# Patient Record
Sex: Female | Born: 1985 | Race: White | Hispanic: No | Marital: Single | Smoking: Never smoker
Health system: Southern US, Community
[De-identification: ages and names within clinical notes are randomized; demographics above are authoritative.]

---
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
## 2006-04-29 ENCOUNTER — Emergency Department: Payer: Self-pay | Admitting: Emergency Medicine

## 2006-08-07 ENCOUNTER — Emergency Department: Payer: Self-pay | Admitting: Emergency Medicine

## 2007-05-14 ENCOUNTER — Observation Stay: Payer: Self-pay

## 2008-09-21 ENCOUNTER — Emergency Department: Payer: Self-pay | Admitting: Emergency Medicine

## 2008-09-29 ENCOUNTER — Ambulatory Visit: Payer: Self-pay | Admitting: Emergency Medicine

## 2008-11-07 ENCOUNTER — Ambulatory Visit: Payer: Self-pay | Admitting: Family Medicine

## 2009-04-21 ENCOUNTER — Emergency Department: Payer: Self-pay | Admitting: Emergency Medicine

## 2010-12-06 ENCOUNTER — Emergency Department: Payer: Self-pay | Admitting: Emergency Medicine

## 2011-01-04 ENCOUNTER — Emergency Department: Payer: Self-pay | Admitting: Emergency Medicine

## 2012-03-21 ENCOUNTER — Emergency Department: Payer: Self-pay | Admitting: *Deleted

## 2012-03-25 LAB — WOUND CULTURE

## 2013-12-18 ENCOUNTER — Emergency Department: Payer: Self-pay | Admitting: Internal Medicine

## 2013-12-26 ENCOUNTER — Emergency Department: Payer: Self-pay | Admitting: Emergency Medicine

## 2013-12-26 LAB — URINALYSIS, COMPLETE
Bilirubin,UR: NEGATIVE
Glucose,UR: NEGATIVE mg/dL (ref 0–75)
NITRITE: NEGATIVE
PH: 5 (ref 4.5–8.0)
RBC,UR: 21 /HPF (ref 0–5)
SPECIFIC GRAVITY: 1.029 (ref 1.003–1.030)
WBC UR: 17 /HPF (ref 0–5)

## 2014-09-12 ENCOUNTER — Emergency Department: Payer: Self-pay | Admitting: Emergency Medicine

## 2014-11-28 ENCOUNTER — Emergency Department: Payer: Self-pay | Admitting: Emergency Medicine

## 2016-05-28 DIAGNOSIS — T63331A Toxic effect of venom of brown recluse spider, accidental (unintentional), initial encounter: Secondary | ICD-10-CM | POA: Insufficient documentation

## 2016-05-28 NOTE — ED Triage Notes (Signed)
Has an insect bite to the back of her head since this morning. Red, raised area with central puncture wound. States she was in the woods when it occurred this morning.

## 2016-05-29 ENCOUNTER — Emergency Department
Admission: EM | Admit: 2016-05-29 | Discharge: 2016-05-29 | Disposition: A | Discharge status: Home / Self Care | Payer: Self-pay | Attending: Emergency Medicine | Admitting: Emergency Medicine

## 2016-05-29 DIAGNOSIS — T63331A Toxic effect of venom of brown recluse spider, accidental (unintentional), initial encounter: Secondary | ICD-10-CM

## 2016-05-29 MED ORDER — HYDROCODONE-ACETAMINOPHEN 5-325 MG PO TABS
ORAL_TABLET | ORAL | Status: AC
  Administered 2016-05-29: 1 via ORAL
  Filled 2016-05-29: qty 1

## 2016-05-29 MED ORDER — TRAMADOL HCL 50 MG PO TABS: 50.0000 mg | ORAL_TABLET | Freq: Four times a day (QID) | ORAL | 0 refills | Status: DC | PRN

## 2016-05-29 MED ORDER — CEPHALEXIN 500 MG PO CAPS: 500.0000 mg | ORAL_CAPSULE | Freq: Four times a day (QID) | ORAL | 0 refills | Status: DC

## 2016-05-29 MED ORDER — HYDROCODONE-ACETAMINOPHEN 5-325 MG PO TABS
1.0000 | ORAL_TABLET | Freq: Once | ORAL | Status: AC
  Administered 2016-05-29: 1 via ORAL

## 2016-05-29 NOTE — ED Provider Notes (Signed)
[facility] Emergency Department Provider Note  ____________________________________________  Time seen: Approximately 12:55 AM  I have reviewed the triage vital signs and the nursing notes.   HISTORY  Chief Complaint Insect Bite    HPI Jocelyn Rodriguez is a 30 y.o. female presents emergency department complaining of a bug bite to the left occipital region of the skull. Patient reports that she was riding/earlier today. She denied knowing exactly when she was bit states that she has a very tender area that is raised to the left occipital region of the skull. No fevers or chills, nausea or vomiting, or other symptoms. Patient reports that pain is sharp, constant, moderate to severe. Patient has tried 800 mg of ibuprofen with no relief.   History reviewed. No pertinent past medical history.  There are no active problems to display for this patient.   History reviewed. No pertinent surgical history.  Prior to Admission medications   Medication Sig Start Date End Date Taking? Authorizing Provider  cephALEXin (KEFLEX) 500 MG capsule Take 1 capsule (500 mg total) by mouth 4 (four) times daily. 05/29/16   Delorise Royals Cuthriell, PA-C  traMADol (ULTRAM) 50 MG tablet Take 1 tablet (50 mg total) by mouth every 6 (six) hours as needed. 05/29/16   Delorise Royals Cuthriell, PA-C    Allergies Review of patient's allergies indicates no known allergies.  No family history on file.  Social History Social History  Substance Use Topics  . Smoking status: Not on file  . Smokeless tobacco: Not on file  . Alcohol use Not on file     Review of Systems  Constitutional: No fever/chills Cardiovascular: no chest pain. Respiratory: no cough. No SOB. Musculoskeletal: Negative for musculoskeletal pain. Skin: Positive for "bug bite" to occipital region of the skull Neurological: Negative for headaches, focal weakness or numbness. 10-point ROS otherwise  negative.  ____________________________________________   PHYSICAL EXAM:  VITAL SIGNS: ED Triage Vitals  Enc Vitals Group     BP --      Pulse Rate 05/28/16 2241 79     Resp --      Temp 05/28/16 2241 98.7 F (37.1 C)     Temp Source 05/28/16 2241 Oral     SpO2 --      Weight 05/28/16 2242 110 lb (49.9 kg)     Height 05/28/16 2242 5\' 2"  (1.575 m)     Head Circumference --      Peak Flow --      Pain Score 05/28/16 2242 7     Pain Loc --      Pain Edu? --      Excl. in GC? --      Constitutional: Alert and oriented. Well appearing and in no acute distress. Eyes: Conjunctivae are normal. PERRL. EOMI. Head: Atraumatic.Erythematous and edematous skin lesion noted to the left occipital region of the skull. Area has central necrosis with surrounding erythema and edema with fasciculation. Area is very tender to palpation. Total erythema and edema is approximately 4 cm in diameter Neck: No stridor.   Hematological/Lymphatic/Immunilogical: No cervical lymphadenopathy. Cardiovascular: Normal rate, regular rhythm. Normal S1 and S2.  Good peripheral circulation. Respiratory: Normal respiratory effort without tachypnea or retractions. Lungs CTAB. Good air entry to the bases with no decreased or absent breath sounds. Musculoskeletal: Full range of motion to all extremities. No gross deformities appreciated. Neurologic:  Normal speech and language. No gross focal neurologic deficits are appreciated.  Skin:  Skin is warm, dry and  intact. No rash noted. Psychiatric: Mood and affect are normal. Speech and behavior are normal. Patient exhibits appropriate insight and judgement.   ____________________________________________   LABS (all labs ordered are listed, but only abnormal results are displayed)  Labs Reviewed - No data to display ____________________________________________  EKG   ____________________________________________  RADIOLOGY   No results  found.  ____________________________________________    PROCEDURES  Procedure(s) performed:    Procedures    Medications  HYDROcodone-acetaminophen (NORCO/VICODIN) 5-325 MG per tablet 1 tablet (1 tablet Oral Given 05/29/16 0105)     ____________________________________________   INITIAL IMPRESSION / ASSESSMENT AND PLAN / ED COURSE  Pertinent labs & imaging results that were available during my care of the patient were reviewed by me and considered in my medical decision making (see chart for details).  Review of the [place] CSRS was performed in accordance of the [facility] prior to dispensing any controlled drugs.  Clinical Course    Patient's diagnosis is consistent with Probable brown recluse bite to the left occipital region of the skull. Lesion is typical of brown recluse spider bite with central necrosis with surrounding erythema and edema and fasciculations.. Patient will be discharged home with prescriptions for antibiotics and limited pain medication. Patient is to follow up with primary care as needed or otherwise directed. Patient is given ED precautions to return to the ED for any worsening or new symptoms.     ____________________________________________  FINAL CLINICAL IMPRESSION(S) / ED DIAGNOSES  Final diagnoses:  Brown recluse spider bite, accidental or unintentional, initial encounter      NEW MEDICATIONS STARTED DURING THIS VISIT:  New Prescriptions   CEPHALEXIN (KEFLEX) 500 MG CAPSULE    Take 1 capsule (500 mg total) by mouth 4 (four) times daily.   TRAMADOL (ULTRAM) 50 MG TABLET    Take 1 tablet (50 mg total) by mouth every 6 (six) hours as needed.        This chart was dictated using voice recognition software/Dragon. Despite best efforts to proofread, errors can occur which can change the meaning. Any change was purely unintentional.    Racheal PatchesJonathan D Cuthriell, PA-C 05/29/16 0108    Rebecka ApleyAllison P Webster, MD 05/30/16 [phone]

## 2019-09-01 ENCOUNTER — Observation Stay
Admission: EM | Admit: 2019-09-01 | Discharge: 2019-09-02 | Disposition: A | Discharge status: Home / Self Care | Payer: Self-pay | Attending: Internal Medicine | Admitting: Internal Medicine

## 2019-09-01 ENCOUNTER — Encounter: Payer: Self-pay | Admitting: Internal Medicine

## 2019-09-01 ENCOUNTER — Other Ambulatory Visit: Payer: Self-pay

## 2019-09-01 DIAGNOSIS — R21 Rash and other nonspecific skin eruption: Secondary | ICD-10-CM | POA: Diagnosis present

## 2019-09-01 DIAGNOSIS — A539 Syphilis, unspecified: Principal | ICD-10-CM | POA: Diagnosis present

## 2019-09-01 DIAGNOSIS — B370 Candidal stomatitis: Secondary | ICD-10-CM | POA: Diagnosis present

## 2019-09-01 DIAGNOSIS — Z20828 Contact with and (suspected) exposure to other viral communicable diseases: Secondary | ICD-10-CM | POA: Insufficient documentation

## 2019-09-01 LAB — URINALYSIS, COMPLETE (UACMP) WITH MICROSCOPIC
Bacteria, UA: NONE SEEN
Bilirubin Urine: NEGATIVE
Glucose, UA: NEGATIVE mg/dL
Hgb urine dipstick: NEGATIVE
Ketones, ur: NEGATIVE mg/dL
Leukocytes,Ua: NEGATIVE
Nitrite: NEGATIVE
Protein, ur: NEGATIVE mg/dL
Specific Gravity, Urine: 1.015 (ref 1.005–1.030)
pH: 8 (ref 5.0–8.0)

## 2019-09-01 LAB — CBC WITH DIFFERENTIAL/PLATELET
Abs Immature Granulocytes: 0.02 10*3/uL (ref 0.00–0.07)
Basophils Absolute: 0.1 10*3/uL (ref 0.0–0.1)
Basophils Relative: 2 %
Eosinophils Absolute: 0.1 10*3/uL (ref 0.0–0.5)
Eosinophils Relative: 2 %
HCT: 39.2 % (ref 36.0–46.0)
Hemoglobin: 12.8 g/dL (ref 12.0–15.0)
Immature Granulocytes: 0 %
Lymphocytes Relative: 22 %
Lymphs Abs: 1.1 10*3/uL (ref 0.7–4.0)
MCH: 28.8 pg (ref 26.0–34.0)
MCHC: 32.7 g/dL (ref 30.0–36.0)
MCV: 88.1 fL (ref 80.0–100.0)
Monocytes Absolute: 0.5 10*3/uL (ref 0.1–1.0)
Monocytes Relative: 11 %
Neutro Abs: 3.1 10*3/uL (ref 1.7–7.7)
Neutrophils Relative %: 63 %
Platelets: 321 10*3/uL (ref 150–400)
RBC: 4.45 MIL/uL (ref 3.87–5.11)
RDW: 12 % (ref 11.5–15.5)
WBC: 5 10*3/uL (ref 4.0–10.5)
nRBC: 0 % (ref 0.0–0.2)

## 2019-09-01 LAB — SEDIMENTATION RATE: Sed Rate: 53 mm/hr — ABNORMAL HIGH (ref 0–20)

## 2019-09-01 LAB — CHLAMYDIA/NGC RT PCR (ARMC ONLY): Chlamydia Tr: DETECTED — AB

## 2019-09-01 LAB — URINE DRUG SCREEN, QUALITATIVE (ARMC ONLY)
Amphetamines, Ur Screen: NOT DETECTED
Barbiturates, Ur Screen: NOT DETECTED
Benzodiazepine, Ur Scrn: NOT DETECTED
Cannabinoid 50 Ng, Ur ~~LOC~~: NOT DETECTED
Cocaine Metabolite,Ur ~~LOC~~: POSITIVE — AB
MDMA (Ecstasy)Ur Screen: NOT DETECTED
Methadone Scn, Ur: NOT DETECTED
Opiate, Ur Screen: NOT DETECTED
Phencyclidine (PCP) Ur S: NOT DETECTED
Tricyclic, Ur Screen: NOT DETECTED

## 2019-09-01 LAB — COMPREHENSIVE METABOLIC PANEL
ALT: 14 U/L (ref 0–44)
AST: 17 U/L (ref 15–41)
Albumin: 3.6 g/dL (ref 3.5–5.0)
Alkaline Phosphatase: 86 U/L (ref 38–126)
Anion gap: 10 (ref 5–15)
BUN: 12 mg/dL (ref 6–20)
CO2: 25 mmol/L (ref 22–32)
Calcium: 8.8 mg/dL — ABNORMAL LOW (ref 8.9–10.3)
Chloride: 103 mmol/L (ref 98–111)
Creatinine, Ser: 0.67 mg/dL (ref 0.44–1.00)
GFR calc Af Amer: >60 mL/min (ref >60)
GFR calc non Af Amer: >60 mL/min (ref >60)
Glucose, Bld: 98 mg/dL (ref 70–99)
Potassium: 4 mmol/L (ref 3.5–5.1)
Sodium: 138 mmol/L (ref 135–145)
Total Bilirubin: 0.5 mg/dL (ref 0.3–1.2)
Total Protein: 8.1 g/dL (ref 6.5–8.1)

## 2019-09-01 LAB — CHLAMYDIA/NGC RT PCR (ARMC ONLY)??????????: N gonorrhoeae: NOT DETECTED

## 2019-09-01 LAB — TSH: TSH: 0.914 u[IU]/mL (ref 0.350–4.500)

## 2019-09-01 LAB — C-REACTIVE PROTEIN: CRP: 5.2 mg/dL — ABNORMAL HIGH (ref <1.0)

## 2019-09-01 LAB — PREGNANCY, URINE: Preg Test, Ur: NEGATIVE

## 2019-09-01 LAB — SARS CORONAVIRUS 2 (TAT 6-24 HRS): SARS Coronavirus 2: NEGATIVE

## 2019-09-01 LAB — T4, FREE: Free T4: 0.76 ng/dL (ref 0.61–1.12)

## 2019-09-01 MED ORDER — DIPHENHYDRAMINE HCL 25 MG PO CAPS
25.0000 mg | ORAL_CAPSULE | Freq: Four times a day (QID) | ORAL | Status: DC | PRN
  Administered 2019-09-01 – 2019-09-02 (×3): 25 mg via ORAL
  Filled 2019-09-01 (×3): qty 1

## 2019-09-01 MED ORDER — ENOXAPARIN SODIUM 40 MG/0.4ML ~~LOC~~ SOLN
40.0000 mg | SUBCUTANEOUS | Status: DC
  Filled 2019-09-01: qty 0.4

## 2019-09-01 MED ORDER — ACETAMINOPHEN 650 MG RE SUPP: 650.0000 mg | Freq: Four times a day (QID) | RECTAL | Status: DC | PRN

## 2019-09-01 MED ORDER — PENICILLIN G BENZATHINE 1200000 UNIT/2ML IM SUSP
2.4000 10*6.[IU] | Freq: Once | INTRAMUSCULAR | Status: AC
  Administered 2019-09-01: 2.4 10*6.[IU] via INTRAMUSCULAR
  Filled 2019-09-01 (×2): qty 4

## 2019-09-01 MED ORDER — ACETAMINOPHEN 325 MG PO TABS: 650.0000 mg | ORAL_TABLET | Freq: Four times a day (QID) | ORAL | Status: DC | PRN

## 2019-09-01 MED ORDER — DEXTROSE 5 % IV SOLN
500.0000 mg | Freq: Once | INTRAVENOUS | Status: AC
  Administered 2019-09-01: 500 mg via INTRAVENOUS
  Filled 2019-09-01: qty 10

## 2019-09-01 MED ORDER — ACYCLOVIR 200 MG PO CAPS
400.0000 mg | ORAL_CAPSULE | Freq: Three times a day (TID) | ORAL | Status: DC
  Administered 2019-09-01 – 2019-09-02 (×2): 400 mg via ORAL
  Filled 2019-09-01 (×5): qty 2

## 2019-09-01 MED ORDER — SODIUM CHLORIDE 0.9 % IV SOLN
INTRAVENOUS | Status: DC
  Administered 2019-09-01 – 2019-09-02 (×5): via INTRAVENOUS

## 2019-09-01 MED ORDER — LORATADINE 10 MG PO TABS
10.0000 mg | ORAL_TABLET | Freq: Every day | ORAL | Status: DC | PRN
  Administered 2019-09-01: 10 mg via ORAL
  Filled 2019-09-01: qty 1

## 2019-09-01 MED ORDER — NYSTATIN 100000 UNIT/ML MT SUSP
5.0000 mL | Freq: Four times a day (QID) | OROMUCOSAL | Status: DC
  Administered 2019-09-01 – 2019-09-02 (×5): 500000 [IU] via ORAL
  Filled 2019-09-01 (×7): qty 5

## 2019-09-01 MED ORDER — ACYCLOVIR 200 MG PO CAPS
400.0000 mg | ORAL_CAPSULE | Freq: Three times a day (TID) | ORAL | Status: DC
  Administered 2019-09-01: 400 mg via ORAL
  Filled 2019-09-01 (×4): qty 2

## 2019-09-01 MED ORDER — ONDANSETRON HCL 4 MG PO TABS: 4.0000 mg | ORAL_TABLET | Freq: Four times a day (QID) | ORAL | Status: DC | PRN

## 2019-09-01 MED ORDER — ONDANSETRON HCL 4 MG/2ML IJ SOLN: 4.0000 mg | Freq: Four times a day (QID) | INTRAMUSCULAR | Status: DC | PRN

## 2019-09-01 MED ORDER — DIPHENHYDRAMINE HCL 50 MG/ML IJ SOLN
50.0000 mg | Freq: Once | INTRAMUSCULAR | Status: AC
Start: 2019-09-01 — End: 2019-09-01
  Administered 2019-09-01: 50 mg via INTRAVENOUS
  Filled 2019-09-01: qty 1

## 2019-09-01 MED ORDER — HEPARIN SODIUM (PORCINE) 5000 UNIT/ML IJ SOLN: 5000.0000 [IU] | Freq: Three times a day (TID) | INTRAMUSCULAR | Status: DC

## 2019-09-01 NOTE — H&P (Addendum)
History and Physical    [name]:[id number] DOB: Jul 30, 1986 DOA: 09/01/2019  Referring MD/NP/PA:   PCP: Patient, No Pcp Per   Patient coming from:  The patient is coming from home.  At baseline, pt is independent for most of ADL.        Chief Complaint: rash  HPI: Jocelyn Rodriguez is a 33 y.o. female without significant medical history, presents with rash.  Pt states that her rash started more than one week ago, which has been progressively worsening.  Since this morning, she noted diffuse rash all over her body. Pt has lesions from her scalp across her arms, trunk, legs, around her genitals. Pt states rash hurts over the bulk of her body, but itches around her genitals. Rash is varying in size, dull red in color, papular shape. Patient has vesicular rash in her vagina. No oral lesion, but has oral thrush on exam.  She denies drug use, but her UDS is positive for cocaine metabolites. She denies history of STDs or syphilis, tick bites, new medications. She reports that she slept in the same bed as her boyfriend and he does not have any similar rash.Patient denies any chest pain, shortness of breath, cough, fever or chills.  No nausea, vomiting, diarrhea, abdominal pain.  She reports dysuria and burning on urination sometimes.  ED Course: pt was found to have WBC 5.0, negative pregnancy test, UDS positive for cocaine metabolites, pending COVID-19 test, pending urinalysis, electrolytes renal function okay, temperature 99.7, blood pressure 102/69, heart rate 109, 89, oxygen saturation 99% on room air.  Patient is placed on MedSurg bed for observation.  ID was consulted by EDP, and will see patient tomorrow per ED physician.  Review of Systems:   General: no fevers, chills, no body weight gain, has fatigue HEENT: no blurry vision, hearing changes. Has oral thrush. Respiratory: no dyspnea, coughing, wheezing CV: no chest pain, no palpitations GI: no nausea, vomiting, abdominal  pain, diarrhea, constipation GU: no dysuria, burning on urination, increased urinary frequency, hematuria  Ext: no leg edema Neuro: no unilateral weakness, numbness, or tingling, no vision change or hearing loss Skin: has diffuse rash. Rash is varying in size, dull red in color, papular shape. Patient has vesicular rash in her vagina MSK: No muscle spasm, no deformity, no limitation of range of movement in spin Heme: No easy bruising.  Travel history: No recent long distant travel.  Allergy: No Known Allergies  No past medical history on file.  No past surgical history on file.  Social History:  reports that she has never smoked. She has never used smokeless tobacco. She reports that she does not drink alcohol or use drugs.  Family History:  Family History  Problem Relation Age of Onset  . Diabetes Mellitus II Mother   . Hypertension Mother      Prior to Admission medications   Medication Sig Start Date End Date Taking? Authorizing Provider  cephALEXin (KEFLEX) 500 MG capsule Take 1 capsule (500 mg total) by mouth 4 (four) times daily. 05/29/16   Cuthriell, Delorise Royals, PA-C  traMADol (ULTRAM) 50 MG tablet Take 1 tablet (50 mg total) by mouth every 6 (six) hours as needed. 05/29/16   Racheal Patches, PA-C    Physical Exam: Vitals:   09/01/19 1415 09/01/19 1726 09/01/19 1758 09/01/19 1759  BP: 109/63 106/64  101/62  Pulse: 93 99  100  Resp: Temp:   99.1 F (37.3 C) 99.1 F (  37.3 C)  TempSrc:   Oral   SpO2: 100% 100%  100%  Weight:      Height:       General: Not in acute distress HEENT:       Eyes: PERRL, EOMI, no scleral icterus.       ENT: No discharge from the ears and nose, no pharynx injection, no tonsillar enlargement.        Neck: No JVD, no bruit, no mass felt. Heme: No neck lymph node enlargement. Cardiac: S1/S2, RRR, No murmurs, No gallops or rubs. Respiratory: No rales, wheezing, rhonchi or rubs. GI: Soft, nondistended, nontender, no rebound  pain, no organomegaly, BS present. GU: No hematuria Ext: No pitting leg edema bilaterally. 2+DP/PT pulse bilaterally. Musculoskeletal: No joint deformities, No joint redness or warmth, no limitation of ROM in spin. Skin: has diffused rash.  Neuro: Alert, oriented X3, cranial nerves II-XII grossly intact, moves all extremities normally.  Psych: Patient is not psychotic, no suicidal or hemocidal ideation.  Labs on Admission: I have personally reviewed following labs and imaging studies  CBC: Recent Labs  Lab 09/01/19 0626  WBC 5.0  NEUTROABS 3.1  HGB 12.8  HCT 39.2  MCV 88.1  PLT 321   Basic Metabolic Panel: Recent Labs  Lab 09/01/19 0626  NA 138  K 4.0  CL 103  CO2 25  GLUCOSE 98  BUN 12  CREATININE 0.67  CALCIUM 8.8*   GFR: Estimated Creatinine Clearance: 79.8 mL/min (by C-G formula based on SCr of 0.67 mg/dL). Liver Function Tests: Recent Labs  Lab 09/01/19 0626  AST 17  ALT 14  ALKPHOS 86  BILITOT 0.5  PROT 8.1  ALBUMIN 3.6   No results for input(s): LIPASE, AMYLASE in the last 168 hours. No results for input(s): AMMONIA in the last 168 hours. Coagulation Profile: No results for input(s): INR, PROTIME in the last 168 hours. Cardiac Enzymes: No results for input(s): CKTOTAL, CKMB, CKMBINDEX, TROPONINI in the last 168 hours. BNP (last 3 results) No results for input(s): PROBNP in the last 8760 hours. HbA1C: No results for input(s): HGBA1C in the last 72 hours. CBG: No results for input(s): GLUCAP in the last 168 hours. Lipid Profile: No results for input(s): CHOL, HDL, LDLCALC, TRIG, CHOLHDL, LDLDIRECT in the last 72 hours. Thyroid Function Tests: Recent Labs    09/01/19 0626  TSH 0.914  FREET4 0.76   Anemia Panel: No results for input(s): VITAMINB12, FOLATE, FERRITIN, TIBC, IRON, RETICCTPCT in the last 72 hours. Urine analysis:    Component Value Date/Time   COLORURINE YELLOW (A) 09/01/2019 0831   APPEARANCEUR CLOUDY (A) 09/01/2019 0831    APPEARANCEUR Cloudy 12/26/2013 1019   LABSPEC 1.015 09/01/2019 0831   LABSPEC 1.029 12/26/2013 1019   PHURINE 8.0 09/01/2019 0831   GLUCOSEU NEGATIVE 09/01/2019 0831   GLUCOSEU Negative 12/26/2013 1019   HGBUR NEGATIVE 09/01/2019 0831   BILIRUBINUR NEGATIVE 09/01/2019 0831   BILIRUBINUR Negative 12/26/2013 1019   KETONESUR NEGATIVE 09/01/2019 0831   PROTEINUR NEGATIVE 09/01/2019 0831   NITRITE NEGATIVE 09/01/2019 0831   LEUKOCYTESUR NEGATIVE 09/01/2019 0831   LEUKOCYTESUR 3+ 12/26/2013 1019   Sepsis Labs: @LABRCNTIP (procalcitonin:4,lacticidven:4) ) Recent Results (from the past 240 hour(s))  Blood culture (routine x 2)     Status: None (Preliminary result)   Collection Time: 09/01/19  6:26 AM   Specimen: BLOOD  Result Value Ref Range Status   Specimen Description BLOOD LEFT ANTECUBITAL  Final   Special Requests   Final    BOTTLES  DRAWN AEROBIC AND ANAEROBIC Blood Culture adequate volume   Culture   Final    NO GROWTH <12 HOURS Performed at [facility], [address] [place], [place] [address]    Report Status PENDING  Incomplete  Blood culture (routine x 2)     Status: None (Preliminary result)   Collection Time: 09/01/19  6:27 AM   Specimen: BLOOD  Result Value Ref Range Status   Specimen Description BLOOD BLOOD LEFT FOREARM  Final   Special Requests   Final    BOTTLES DRAWN AEROBIC AND ANAEROBIC Blood Culture adequate volume   Culture   Final    NO GROWTH <12 HOURS Performed at [facility], [address] [place], [place] [address]    Report Status PENDING  Incomplete  Chlamydia/NGC rt PCR ([facility] only)     Status: Abnormal   Collection Time: 09/01/19  8:31 AM   Specimen: Urine  Result Value Ref Range Status   Specimen source GC/Chlam URINE, RANDOM  Final   Chlamydia Tr DETECTED (A) NOT DETECTED Final   N gonorrhoeae NOT DETECTED NOT DETECTED Final    Comment: (NOTE) This CT/NG assay has not been evaluated in patients with a history of   hysterectomy. Performed at [facility], [address] [place], [place] [address]   SARS CORONAVIRUS 2 (TAT 6-24 HRS) Nasopharyngeal Nasopharyngeal Swab     Status: None   Collection Time: 09/01/19 10:39 AM   Specimen: Nasopharyngeal Swab  Result Value Ref Range Status   SARS Coronavirus 2 NEGATIVE NEGATIVE Final    Comment: (NOTE) SARS-CoV-2 target nucleic acids are NOT DETECTED. The SARS-CoV-2 RNA is generally detectable in upper and lower respiratory specimens during the acute phase of infection. Negative results do not preclude SARS-CoV-2 infection, do not rule out co-infections with other pathogens, and should not be used as the sole basis for treatment or other patient management decisions. Negative results must be combined with clinical observations, patient history, and epidemiological information. The expected result is Negative. Fact Sheet for Patients: HairSlick.nohttps://www.fda.gov/media/138098/download Fact Sheet for Healthcare Providers: quierodirigir.comhttps://www.fda.gov/media/138095/download This test is not yet approved or cleared by the [place] FDA and  has been authorized for detection and/or diagnosis of SARS-CoV-2 by FDA under an Emergency Use Authorization (EUA). This EUA will remain  in effect (meaning this test can be used) for the duration of the COVID-19 declaration under Section 56 4(b)(1) of the Act, 21 U.S.C. section 360bbb-3(b)(1), unless the authorization is terminated or revoked sooner. Performed at [facility], [address] [place], [place] [address]      Radiological Exams on Admission: No results found.   EKG:  Not done in ED, will get one.   Assessment/Plan Principal Problem:   Rash Active Problems:   Oral thrush   Rash: etiology is not clear.  DD is broad, including widespread herpes/ zoster infection, allergy and syphillis. Low suspicions for SJS. Denies IVDU, no fever or leukocytosis, less likely to have endocarditis. Physical  exam was performed in the presence of RN, Rosey Batheresa.  -will place on MedSurg bed for observation -Start acyclovir -Patient was given 2,400,000 unit of penicillin -prn Benadryl for itchy -IVF -Follow-up of blood culture -Will check labs, inflammatory markers, RPR, HIV, HSV, GC chlamydia -per EDP, ID, Dr. Rivka Saferavishankar will see pt in AM.  Oral thrush: -Nystatin started      DVT ppx: SQ Heparin   Code Status: Full code Family Communication: None at bed side.  Disposition Plan:  Anticipate discharge back to previous home environment Consults called:  ID by EDP Admission status: med-surg bed for obs    Date of Service 09/01/2019    [facility]   If 7PM-7AM, please contact night-coverage [email or web address] Password [email or web address] 09/01/2019, 6:01 PM

## 2019-09-01 NOTE — ED Notes (Signed)
Pt provided lunch tray.

## 2019-09-01 NOTE — ED Notes (Addendum)
Pt assisted to toilet. Pt reports itchiness, benadryl given (see eMAR). Pt provided mobile to call children.

## 2019-09-01 NOTE — ED Notes (Signed)
Pt assisted to toilet 

## 2019-09-01 NOTE — Progress Notes (Signed)
Pharmacy Antibiotic Note  Jocelyn Rodriguez is a 33 y.o. 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  Pharmacy has been consulted for oral acyclovir dosing.  Plan: Will order Acyclovir 400mg  PO TID.  Height: 5\' 2"  (157.5 cm) Weight: 115 lb (52.2 kg) IBW/kg (Calculated) : 50.1  Temp (24hrs), Avg:99.7 F (37.6 C), Min:99.7 F (37.6 C), Max:99.7 F (37.6 C)  Recent Labs  Lab 09/01/19 0626  WBC 5.0  CREATININE 0.67    Estimated Creatinine Clearance: 79.8 mL/min (by C-G formula based on SCr of 0.67 mg/dL).    No Known Allergies  Antimicrobials this admission: Acyclovir 12/6 >>   Dose adjustments this admission:   Microbiology results:  Thank you for allowing pharmacy to be a part of this patient's care.  Paulina Fusi, PharmD, BCPS 09/01/2019 11:49 AM

## 2019-09-01 NOTE — ED Notes (Signed)
Pt ambulatory to toilet with steady gait noted.  

## 2019-09-01 NOTE — ED Notes (Signed)
Pt provided mobile phone to call children.

## 2019-09-01 NOTE — ED Notes (Signed)
Pt given phone to call family member. 

## 2019-09-01 NOTE — ED Triage Notes (Signed)
Patient reports woke with itching all over.  Patient noted to be covered in hives.

## 2019-09-01 NOTE — ED Notes (Signed)
Provided pt w meal tray.

## 2019-09-01 NOTE — ED Notes (Signed)
Family brought pt supper. Eating at this time. Informed waiting for transport to take pt to [facility].

## 2019-09-01 NOTE — ED Provider Notes (Signed)
-----------------------------------------   7:04 AM on 09/01/2019 -----------------------------------------  Blood pressure 116/67, pulse (!) 109, temperature 99.7 F (37.6 C), temperature source Oral, resp. rate 20, height '5\' 2"'$  (1.575 m), weight 52.2 kg, SpO2 99 %.  Assuming care from Dr. Alfred Levins.  In short, Jocelyn Rodriguez is a 33 y.o. female with a chief complaint of Allergic Reaction .  Refer to the original H&P for additional details.  The current plan of care is to follow-up labs and consider admission for diffuse rash involving genitalia, trunk and arms. Concern for secondary syphilis vs disseminated HSV vs HIV.  Labs thus far are unremarkable outside of mildly elevated ESR.  Case discussed with infectious disease, while suspicion is high for secondary syphilis, obvious etiology is not clear.  Case was then discussed with hospitalist, who accepts patient for admission.    Blake Divine, MD 09/01/19 [phone]

## 2019-09-01 NOTE — ED Notes (Signed)
Pt on phone with family  

## 2019-09-01 NOTE — ED Provider Notes (Signed)
[facility] Emergency Department Provider Note  ____________________________________________  Time seen: Approximately 6:31 AM  I have reviewed the triage vital signs and the nursing notes.   HISTORY  Chief Complaint Allergic Reaction   HPI Timoteo AceSavannah D Nakagawa is a 33 y.o. female with no significant past medical history who presents for evaluation of a rash.  Patient reports that she went to bed in her usual state of health and woke up this morning with a diffuse rash.  Patient has a vesicular rash in her vagina that is pruritic.  She also has a diffuse papular rash involving the torso, bilateral arms and scalp which is painful.  She reports that her throat feels painful as well.  No visual changes.  She denies fever, IV drug use, known history of STDs or syphilis, tick bites, new medications.  She reports that she slept in the same bed as her boyfriend and he does not have any similar rash.   PMH None - reviewed  Prior to Admission medications   Medication Sig Start Date End Date Taking? Authorizing Provider  cephALEXin (KEFLEX) 500 MG capsule Take 1 capsule (500 mg total) by mouth 4 (four) times daily. 05/29/16   Cuthriell, Delorise RoyalsJonathan D, PA-C  traMADol (ULTRAM) 50 MG tablet Take 1 tablet (50 mg total) by mouth every 6 (six) hours as needed. 05/29/16   Cuthriell, Delorise RoyalsJonathan D, PA-C    Allergies Patient has no known allergies.  No family history on file.  Social History Smoking - yes Drugs - no Alcohol - yes  Review of Systems  Constitutional: Negative for fever. Eyes: Negative for visual changes. ENT: Negative for sore throat. Neck: No neck pain  Cardiovascular: Negative for chest pain. Respiratory: Negative for shortness of breath. Gastrointestinal: Negative for abdominal pain, vomiting or diarrhea. Genitourinary: Negative for dysuria. Musculoskeletal: Negative for back pain. Skin: + rash. Neurological: Negative for headaches, weakness or numbness.  Psych: No SI or HI  ____________________________________________   PHYSICAL EXAM:  VITAL SIGNS: ED Triage Vitals  Enc Vitals Group     BP 09/01/19 0558 116/67     Pulse Rate 09/01/19 0558 (!) 109     Resp 09/01/19 0558 20     Temp 09/01/19 0558 99.7 F (37.6 C)     Temp Source 09/01/19 0558 Oral     SpO2 09/01/19 0558 99 %     Weight 09/01/19 0557 115 lb (52.2 kg)     Height 09/01/19 0557 5\' 2"  (1.575 m)     Head Circumference --      Peak Flow --      Pain Score 09/01/19 0557 0     Pain Loc --      Pain Edu? --      Excl. in GC? --     Constitutional: Alert and oriented. Well appearing and in no apparent distress. HEENT:      Head: Normocephalic and atraumatic.         Eyes: Conjunctivae are normal. Sclera is non-icteric.       Mouth/Throat: Mucous membranes are moist.  Pharynx is clear with no obvious lesions, exudates, or tonsillar hypertrophy      Neck: Supple with no signs of meningismus. Cardiovascular: Regular rate and rhythm. No murmurs, gallops, or rubs. 2+ symmetrical distal pulses are present in all extremities. No JVD. Respiratory: Normal respiratory effort. Lungs are clear to auscultation bilaterally. No wheezes, crackles, or rhonchi.  Gastrointestinal: Soft, non tender, and non distended with positive bowel sounds.  No rebound or guarding. Genitourinary: There are several vesicular lesions on bilateral labia Musculoskeletal: Nontender with normal range of motion in all extremities. No edema, cyanosis, or erythema of extremities. Neurologic: Normal speech and language. Face is symmetric. Moving all extremities. No gross focal neurologic deficits are appreciated. Skin: Skin is warm, dry and intact. Diffuse papular erythematous rash located on torso and b/l UE, no palms or sole involvement. There are bigger lesions with overlying crusting in her scalp Psychiatric: Mood and affect are normal. Speech and behavior are normal.   ____________________________________________   LABS (all labs ordered are listed, but only abnormal results are displayed)  Labs Reviewed  CULTURE, BLOOD (ROUTINE X 2)  CULTURE, BLOOD (ROUTINE X 2)  CBC WITH DIFFERENTIAL/PLATELET  COMPREHENSIVE METABOLIC PANEL  SEDIMENTATION RATE  RPR  HSV(HERPES SIMPLEX VRS) I + II AB-IGM  HIV ANTIBODY (ROUTINE TESTING W REFLEX)  C-REACTIVE PROTEIN   ____________________________________________  EKG  none  ____________________________________________  RADIOLOGY  none  ____________________________________________   PROCEDURES  Procedure(s) performed: None Procedures Critical Care performed:  None ____________________________________________   INITIAL IMPRESSION / ASSESSMENT AND PLAN / ED COURSE   33 y.o. female with no significant past medical history who presents for evaluation of a rash.  Patient is tachycardic with low grade temp. She has two distinct morphologies on exam. Bilateral labia majora has pruritic vesicles that look like herpes infection. There is a wide spread pustular erythematous rash that is painful located on her chest, abdomen, back, bilateral arms. No palms or soles involved.  Patient is complaining of pain with swallowing however on visual inspection I do not see any lesions, exudates, erythema, or any other abnormal findings in inspection of patient's oropharynx.  Eyes did not seem to be affected as well.  She has a bigger lesions with white crusting over them in her scalp which according to the patient are also painful. There are no blisters, no petechial/ purpuric lesions.   Unclear etiology, broad differential including widespread herpes/ zoster infection vs syphillis vs SJS. No h/o IVDU therefore less likely endocarditis.   Will check labs, inflammatory markers, RPR, HIV, HSV. Will cover patient with acyclovir.        As part of my medical decision making, I reviewed the following data within the electronic  MEDICAL RECORD NUMBER Nursing notes reviewed and incorporated, Labs reviewed , Old chart reviewed, Patient signed out to Dr. Larinda Buttery, Notes from prior ED visits and [place] Controlled Substance Database   Please note:  Patient was evaluated in Emergency Department today for the symptoms described in the history of present illness. Patient was evaluated in the context of the global COVID-19 pandemic, which necessitated consideration that the patient might be at risk for infection with the SARS-CoV-2 virus that causes COVID-19. Institutional protocols and algorithms that pertain to the evaluation of patients at risk for COVID-19 are in a state of rapid change based on information released by regulatory bodies including the CDC and federal and state organizations. These policies and algorithms were followed during the patient's care in the ED.  Some ED evaluations and interventions may be delayed as a result of limited staffing during the pandemic.   ____________________________________________   FINAL CLINICAL IMPRESSION(S) / ED DIAGNOSES   Final diagnoses:  Rash      NEW MEDICATIONS STARTED DURING THIS VISIT:  ED Discharge Orders    None       Note:  This document was prepared using Dragon voice recognition software and may include unintentional dictation  errors.    Rudene Re, MD 09/03/19 2329

## 2019-09-01 NOTE — ED Notes (Signed)
Pt provided apple sauce and cola to drink.

## 2019-09-01 NOTE — ED Notes (Signed)
See triage note. Pt states Sx onset approx a week ago and have gotten progressively worse. Pt has lesions from her scalp across her arms, trunk, legs, around her genitals and in her mouth. Lesions are varying in size and are dull red in color. Pt states rash hurts over the bulk of her body but itches around her genitals. Pt is A&Ox4, NAD, no respiratory Sx evident. Dr Alfred Levins at bedside.

## 2019-09-02 DIAGNOSIS — A749 Chlamydial infection, unspecified: Secondary | ICD-10-CM

## 2019-09-02 DIAGNOSIS — L989 Disorder of the skin and subcutaneous tissue, unspecified: Secondary | ICD-10-CM

## 2019-09-02 DIAGNOSIS — F1490 Cocaine use, unspecified, uncomplicated: Secondary | ICD-10-CM

## 2019-09-02 DIAGNOSIS — A539 Syphilis, unspecified: Secondary | ICD-10-CM

## 2019-09-02 DIAGNOSIS — R21 Rash and other nonspecific skin eruption: Secondary | ICD-10-CM

## 2019-09-02 LAB — CBC
HCT: 32.1 % — ABNORMAL LOW (ref 36.0–46.0)
Hemoglobin: 10.8 g/dL — ABNORMAL LOW (ref 12.0–15.0)
MCH: 29.1 pg (ref 26.0–34.0)
MCHC: 33.6 g/dL (ref 30.0–36.0)
MCV: 86.5 fL (ref 80.0–100.0)
Platelets: 216 10*3/uL (ref 150–400)
RBC: 3.71 MIL/uL — ABNORMAL LOW (ref 3.87–5.11)
RDW: 12.2 % (ref 11.5–15.5)
WBC: 4.4 10*3/uL (ref 4.0–10.5)
nRBC: 0 % (ref 0.0–0.2)

## 2019-09-02 LAB — BASIC METABOLIC PANEL
Anion gap: 4 — ABNORMAL LOW (ref 5–15)
BUN: 6 mg/dL (ref 6–20)
CO2: 24 mmol/L (ref 22–32)
Calcium: 7.8 mg/dL — ABNORMAL LOW (ref 8.9–10.3)
Chloride: 111 mmol/L (ref 98–111)
Creatinine, Ser: 0.72 mg/dL (ref 0.44–1.00)
GFR calc Af Amer: >60 mL/min (ref >60)
GFR calc non Af Amer: >60 mL/min (ref >60)
Glucose, Bld: 110 mg/dL — ABNORMAL HIGH (ref 70–99)
Potassium: 3.7 mmol/L (ref 3.5–5.1)
Sodium: 139 mmol/L (ref 135–145)

## 2019-09-02 LAB — RPR
RPR Ser Ql: REACTIVE — AB
RPR Titer: >16

## 2019-09-02 LAB — HIV ANTIBODY (ROUTINE TESTING W REFLEX): HIV Screen 4th Generation wRfx: NONREACTIVE

## 2019-09-02 MED ORDER — AZITHROMYCIN 1 G PO PACK
1.0000 g | PACK | Freq: Once | ORAL | Status: AC
  Administered 2019-09-02: 1 g via ORAL
  Filled 2019-09-02: qty 1

## 2019-09-02 MED ORDER — ACYCLOVIR 200 MG PO CAPS: 400.0000 mg | ORAL_CAPSULE | Freq: Three times a day (TID) | ORAL | 0 refills | Status: AC

## 2019-09-02 NOTE — Consult Note (Addendum)
NAME: Jocelyn Rodriguez  DOB: 04/27/1986  MRN: [id number]  Date/Time: 09/02/2019 3:10 PM  REQUESTING PROVIDER: Myriam ForehandAyiku Subjective:  REASON FOR CONSULT: Rash ? Jocelyn Rodriguez is a 33 y.o. female presented to the ED on 09/01/19 with a rash of 1 week duration. As per patient the rash she has rash  on her trunk  Which is itchy. She has painful lesions on her genital area and painful lesions on her scalp She gives no medical history. She is not forth coming with information and gets testy when getting a history She does cocaine ( as evident on tox screen and I told her that)  but denied IVDA She lives with her husband and 2 children. In the ED they gave her a dose of Benzathine penicillin and sent RPR. They started her on acyclovir for possible herpes Urine chlamydia is positive Pt is wanting to go home now  [facility] with her husband and 2 children Uses cocaine  Family History  Problem Relation Age of Onset  . Diabetes Mellitus II Mother   . Hypertension Mother    No Known Allergies  ? Current Facility-Administered Medications  Medication Dose Route Frequency Provider Last Rate Last Dose  . 0.9 %  sodium chloride infusion   Intravenous Continuous Lorretta HarpNiu, Xilin, MD 125 mL/hr at 09/02/19 1158    . acetaminophen (TYLENOL) tablet 650 mg  650 mg Oral Q6H PRN Lorretta HarpNiu, Xilin, MD       Or  . acetaminophen (TYLENOL) suppository 650 mg  650 mg Rectal Q6H PRN Lorretta HarpNiu, Xilin, MD      . acyclovir (ZOVIRAX) 200 MG capsule 400 mg  400 mg Oral TID Lorretta HarpNiu, Xilin, MD   400 mg at 09/02/19 0753  . diphenhydrAMINE (BENADRYL) capsule 25 mg  25 mg Oral Q6H PRN Lorretta HarpNiu, Xilin, MD   25 mg at 09/02/19 1404  . enoxaparin (LOVENOX) injection 40 mg  40 mg Subcutaneous Q24H Lorretta HarpNiu, Xilin, MD      . loratadine (CLARITIN) tablet 10 mg  10 mg Oral Daily PRN Lorretta HarpNiu, Xilin, MD   10 mg at 09/01/19 2120  . nystatin (MYCOSTATIN) 100000 UNIT/ML suspension 500,000 Units  5 mL Oral QID Lorretta HarpNiu, Xilin, MD   500,000 Units at 09/02/19 1404  .  ondansetron (ZOFRAN) tablet 4 mg  4 mg Oral Q6H PRN Lorretta HarpNiu, Xilin, MD       Or  . ondansetron [facility](ZOFRAN) injection 4 mg  4 mg Intravenous Q6H PRN Lorretta HarpNiu, Xilin, MD         Abtx:  Anti-infectives (From admission, onward)   Start     Dose/Rate Route Frequency Ordered Stop   09/02/19 1130  azithromycin (ZITHROMAX) powder 1 g     1 g Oral  Once 09/02/19 1127 09/02/19 1404   09/01/19 1915  acyclovir (ZOVIRAX) 200 MG capsule 400 mg     400 mg Oral 3 times daily 09/01/19 1907     09/01/19 1200  acyclovir (ZOVIRAX) 200 MG capsule 400 mg  Status:  Discontinued     400 mg Oral 3 times daily 09/01/19 1147 09/01/19 1907   09/01/19 0700  penicillin g benzathine (BICILLIN LA) 1200000 UNIT/2ML injection 2.4 Million Units     2.4 Million Units Intramuscular  Once 09/01/19 0655 09/01/19 0741   09/01/19 0630  acyclovir (ZOVIRAX) 500 mg in dextrose 5 % 100 mL IVPB     500 mg 110 mL/hr over 60 Minutes Intravenous  Once 09/01/19 0622 09/01/19 0910      REVIEW  OF SYSTEMS:  Const: negative fever, negative chills, negative weight loss Eyes: negative diplopia or visual changes, negative eye pain ENT: negative coryza, negative sore throat Resp: negative cough, hemoptysis, dyspnea Cards: negative for chest pain, palpitations, lower extremity edema GU: negative for frequency, dysuria and hematuria GI: Negative for abdominal pain, diarrhea, bleeding, constipation Skin:as above Heme: negative for easy bruising and gum/nose bleeding MS: negative for myalgias, arthralgias, back pain and muscle weakness Neurolo:negative for headaches, dizziness, vertigo, memory problems  Psych: negative for feelings of anxiety, depression  Endocrine: negative for thyroid, diabetes Allergy/Immunology- negative for any medication or food allergies ?  Objective:  VITALS:  BP 98/64 (BP Location: Right Arm)   Pulse 81   Temp 98.1 F (36.7 C) (Oral)   Resp 16   Ht 5\' 2"  (1.575 m)   Wt 52.2 kg   SpO2 100%   BMI 21.03 kg/m   PHYSICAL EXAM:  General: Alert, cooperative, no distress, appears stated age.  Head: Normocephalic, without obvious abnormality, atraumatic. Eyes: Conjunctivae clear, anicteric sclerae. Pupils are equal ENT Nares normal. No drainage or sinus tenderness. Lips, mucosa, and tongue normal. No Thrush Neck: Supple, symmetrical, no adenopathy, thyroid: non tender no carotid bruit and no JVD. Back: No CVA tenderness. Lungs: Clear to auscultation bilaterally. No Wheezing or Rhonchi. No rales. Heart: Regular rate and rhythm, no murmur, rub or gallop. Abdomen: Soft, non-tender,not distended. Bowel sounds normal. No masses Extremities: atraumatic, no cyanosis. No edema. No clubbing Skin: papular rash over her trunk   Scalp has painful flat lesions     Punched out lesions multiple on the labial mucosa, Lymph: Cervical, supraclavicular normal. Neurologic: Grossly non-focal Pertinent Labs Lab Results CBC    Component Value Date/Time   WBC 4.4 09/02/2019 0702   RBC 3.71 (L) 09/02/2019 0702   HGB 10.8 (L) 09/02/2019 0702   HCT 32.1 (L) 09/02/2019 0702   PLT 216 09/02/2019 0702   MCV 86.5 09/02/2019 0702   MCH 29.1 09/02/2019 0702   MCHC 33.6 09/02/2019 0702   RDW 12.2 09/02/2019 0702   LYMPHSABS 1.1 09/01/2019 0626   MONOABS 0.5 09/01/2019 0626   EOSABS 0.1 09/01/2019 0626   BASOSABS 0.1 09/01/2019 0626    CMP Latest Ref Rng & Units 09/02/2019 09/01/2019  Glucose 70 - 99 mg/dL 110(H) 98  BUN 6 - 20 mg/dL 6 12  Creatinine 0.44 - 1.00 mg/dL 0.72 0.67  Sodium 135 - 145 mmol/L 139 138  Potassium 3.5 - 5.1 mmol/L 3.7 4.0  Chloride 98 - 111 mmol/L 111 103  CO2 22 - 32 mmol/L 24 25  Calcium 8.9 - 10.3 mg/dL 7.8(L) 8.8(L)  Total Protein 6.5 - 8.1 g/dL - 8.1  Total Bilirubin 0.3 - 1.2 mg/dL - 0.5  Alkaline Phos 38 - 126 U/L - 86  AST 15 - 41 U/L - 17  ALT 0 - 44 U/L - 14      Microbiology: Recent Results (from the past 240 hour(s))  Blood culture (routine x 2)     Status: None  (Preliminary result)   Collection Time: 09/01/19  6:26 AM   Specimen: BLOOD  Result Value Ref Range Status   Specimen Description BLOOD LEFT ANTECUBITAL  Final   Special Requests   Final    BOTTLES DRAWN AEROBIC AND ANAEROBIC Blood Culture adequate volume   Culture   Final    NO GROWTH < 24 HOURS Performed at [facility], [address], [place], [place] [address]    Report Status PENDING  Incomplete  Blood culture (routine x 2)     Status: None (Preliminary result)   Collection Time: 09/01/19  6:27 AM   Specimen: BLOOD  Result Value Ref Range Status   Specimen Description BLOOD BLOOD LEFT FOREARM  Final   Special Requests   Final    BOTTLES DRAWN AEROBIC AND ANAEROBIC Blood Culture adequate volume   Culture   Final    NO GROWTH < 24 HOURS Performed at [facility], [address], [place], [place] [address]    Report Status PENDING  Incomplete  Chlamydia/NGC rt PCR ([facility] only)     Status: Abnormal   Collection Time: 09/01/19  8:31 AM   Specimen: Urine  Result Value Ref Range Status   Specimen source GC/Chlam URINE, RANDOM  Final   Chlamydia Tr DETECTED (A) NOT DETECTED Final   N gonorrhoeae NOT DETECTED NOT DETECTED Final    Comment: (NOTE) This CT/NG assay has not been evaluated in patients with a history of  hysterectomy. Performed at [facility], [address] [place], [place] [address]   SARS CORONAVIRUS 2 (TAT 6-24 HRS) Nasopharyngeal Nasopharyngeal Swab     Status: None   Collection Time: 09/01/19 10:39 AM   Specimen: Nasopharyngeal Swab  Result Value Ref Range Status   SARS Coronavirus 2 NEGATIVE NEGATIVE Final    Comment: (NOTE) SARS-CoV-2 target nucleic acids are NOT DETECTED. The SARS-CoV-2 RNA is generally detectable in upper and lower respiratory specimens during the acute phase of infection. Negative results do not preclude SARS-CoV-2 infection, do not rule out co-infections with other pathogens, and should not be used as  the sole basis for treatment or other patient management decisions. Negative results must be combined with clinical observations, patient history, and epidemiological information. The expected result is Negative. Fact Sheet for Patients: HairSlick.no Fact Sheet for Healthcare Providers: quierodirigir.com This test is not yet approved or cleared by the [place] FDA and  has been authorized for detection and/or diagnosis of SARS-CoV-2 by FDA under an Emergency Use Authorization (EUA). This EUA will remain  in effect (meaning this test can be used) for the duration of the COVID-19 declaration under Section 56 4(b)(1) of the Act, 21 U.S.C. section 360bbb-3(b)(1), unless the authorization is terminated or revoked sooner. Performed at [facility], [address] [place], [place] [address]     RPR positive > 1:16 Impression/Recommendation ? 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 Herpes ?? Syphilis On acyclovir and will contine for 7 days  Scalp lesions- not sure whether this is syphilis also?? Chlamydia positive in the urine- she got a dose of zithromax She did not get cervical swab- she did not want a gyn examination. Says she has a IUD and will [facility] the [facility]  Cocaine use  Discussed partner notification and asked her let her husband go and get treated for syphilis  Pt is waiting to go home  Will get HIV, Hepatitis c  And HIV RNA   ?Will follow her as OP to discuss results of HIV ? ___________________________________________________ Discussed with patient and  requesting provider Note:  This document was prepared using Dragon voice recognition software and may include unintentional dictation errors.

## 2019-09-02 NOTE — Discharge Summary (Signed)
Physician Discharge Summary  [name] BPZ:[id number] DOB: 1986-05-31 DOA: 09/01/2019  PCP: Patient, No Pcp Per  Admit date: 09/01/2019 Discharge date: 09/02/2019  Discharge disposition: Home   Recommendations for Outpatient Follow-Up:    Outpatient follow up with infectious disease specialist, Dr. Delaine Lame, in 1 to 2 weeks   Discharge Diagnosis:   Principal Problem:   Syphilis Active Problems:   Rash   Oral thrush    Discharge Condition: Stable.  Diet recommendation: Regular diet  Code status: Full code    Hospital Course:    [name] is a 33 year old with history of cocaine abuse who presented to the hospital with a 1 week history of rash.  She said she developed a rash on her trunk, upper and lower extremities.  She also reports a rash on her vulva.  She said, initially the rash was itchy and other areas were painful.  However, she said the itching and pain have improved and so has the rash on her trunk.  She said she was feeling better and wanted to be discharged home.    Of note, RPR test was positive and she received a dose of benzathine penicillin 2.4 million units in the ED for syphilis infection.  She was also started on acyclovir for suspected genital herpes.  Chlamydia PCR urine test was positive and she received a single dose of oral azithromycin 1 g.  She was advised to inform her partner about his diagnosis antibiotic coverage to get tested for syphilis and Chlamydia infections.  She was seen in consultation by infectious disease specialist and hepatitis C, HIV tests were ordered as well.  She is deemed stable for discharge to home today.    Medical Consultants:    Infectious disease specialist   Discharge Exam:   Vitals:   09/02/19 0606 09/02/19 1226  BP: 97/60 98/64  Pulse: 85 81  Resp:  16  Temp: 98.6 F (37 C) 98.1 F (36.7 C)  SpO2: 100% 100%   Vitals:   09/01/19 1759 09/01/19 2228 09/02/19 0606 09/02/19 1226  BP:  101/62 108/66 97/60 98/64   Pulse: 100 91 85 81  Resp: 16 18  16   Temp: 99.1 F (37.3 C) 98.4 F (36.9 C) 98.6 F (37 C) 98.1 F (36.7 C)  TempSrc: Oral   Oral  SpO2: 100% 100% 100% 100%  Weight:      Height:         GEN: NAD SKIN: Diffuse erythematous maculopapular rash on trunk and extremities, Flat, coin sized, erosion/superficial ulcer on the scalp EYES: EOMI, no pallor or icterus ENT: MMM CV: RRR PULM: CTA B ABD: soft, ND, NT, +BS CNS: AAO x 3, non focal EXT: No edema or tenderness GU: Vaginal exam was not done   The results of significant diagnostics from this hospitalization (including imaging, microbiology, ancillary and laboratory) are listed below for reference.     Procedures and Diagnostic Studies:   No results found.   Labs:   Basic Metabolic Panel: Recent Labs  Lab 09/01/19 0626 09/02/19 0702  NA 138 139  K 4.0 3.7  CL 103 111  CO2 25 24  GLUCOSE 98 110*  BUN 12 6  CREATININE 0.67 0.72  CALCIUM 8.8* 7.8*   GFR Estimated Creatinine Clearance: 79.8 mL/min (by C-G formula based on SCr of 0.72 mg/dL). Liver Function Tests: Recent Labs  Lab 09/01/19 0626  AST 17  ALT 14  ALKPHOS 86  BILITOT 0.5  PROT 8.1  ALBUMIN 3.6  No results for input(s): LIPASE, AMYLASE in the last 168 hours. No results for input(s): AMMONIA in the last 168 hours. Coagulation profile No results for input(s): INR, PROTIME in the last 168 hours.  CBC: Recent Labs  Lab 09/01/19 0626 09/02/19 0702  WBC 5.0 4.4  NEUTROABS 3.1  --   HGB 12.8 10.8*  HCT 39.2 32.1*  MCV 88.1 86.5  PLT 321 216   Cardiac Enzymes: No results for input(s): CKTOTAL, CKMB, CKMBINDEX, TROPONINI in the last 168 hours. BNP: Invalid input(s): POCBNP CBG: No results for input(s): GLUCAP in the last 168 hours. D-Dimer No results for input(s): DDIMER in the last 72 hours. Hgb A1c No results for input(s): HGBA1C in the last 72 hours. Lipid Profile No results for input(s): CHOL, HDL,  LDLCALC, TRIG, CHOLHDL, LDLDIRECT in the last 72 hours. Thyroid function studies Recent Labs    09/01/19 0626  TSH 0.914   Anemia work up No results for input(s): VITAMINB12, FOLATE, FERRITIN, TIBC, IRON, RETICCTPCT in the last 72 hours. Microbiology Recent Results (from the past 240 hour(s))  Blood culture (routine x 2)     Status: None (Preliminary result)   Collection Time: 09/01/19  6:26 AM   Specimen: BLOOD  Result Value Ref Range Status   Specimen Description BLOOD LEFT ANTECUBITAL  Final   Special Requests   Final    BOTTLES DRAWN AEROBIC AND ANAEROBIC Blood Culture adequate volume   Culture   Final    NO GROWTH < 24 HOURS Performed at [facility], [address] [place], [place] [address]    Report Status PENDING  Incomplete  Blood culture (routine x 2)     Status: None (Preliminary result)   Collection Time: 09/01/19  6:27 AM   Specimen: BLOOD  Result Value Ref Range Status   Specimen Description BLOOD BLOOD LEFT FOREARM  Final   Special Requests   Final    BOTTLES DRAWN AEROBIC AND ANAEROBIC Blood Culture adequate volume   Culture   Final    NO GROWTH < 24 HOURS Performed at [facility], [address] [place], [place] [address]    Report Status PENDING  Incomplete  Chlamydia/NGC rt PCR ([facility] only)     Status: Abnormal   Collection Time: 09/01/19  8:31 AM   Specimen: Urine  Result Value Ref Range Status   Specimen source GC/Chlam URINE, RANDOM  Final   Chlamydia Tr DETECTED (A) NOT DETECTED Final   N gonorrhoeae NOT DETECTED NOT DETECTED Final    Comment: (NOTE) This CT/NG assay has not been evaluated in patients with a history of  hysterectomy. Performed at [facility], [address] [place], [place] [address]   SARS CORONAVIRUS 2 (TAT 6-24 HRS) Nasopharyngeal Nasopharyngeal Swab     Status: None   Collection Time: 09/01/19 10:39 AM   Specimen: Nasopharyngeal Swab  Result Value Ref Range Status   SARS Coronavirus 2  NEGATIVE NEGATIVE Final    Comment: (NOTE) SARS-CoV-2 target nucleic acids are NOT DETECTED. The SARS-CoV-2 RNA is generally detectable in upper and lower respiratory specimens during the acute phase of infection. Negative results do not preclude SARS-CoV-2 infection, do not rule out co-infections with other pathogens, and should not be used as the sole basis for treatment or other patient management decisions. Negative results must be combined with clinical observations, patient history, and epidemiological information. The expected result is Negative. Fact Sheet for Patients: HairSlick.nohttps://www.fda.gov/media/138098/download Fact Sheet for Healthcare Providers: quierodirigir.comhttps://www.fda.gov/media/138095/download This test is not  yet approved or cleared by the [place] and  has been authorized for detection and/or diagnosis of SARS-CoV-2 by FDA under an Emergency Use Authorization (EUA). This EUA will remain  in effect (meaning this test can be used) for the duration of the COVID-19 declaration under Section 56 4(b)(1) of the Act, 21 U.S.C. section 360bbb-3(b)(1), unless the authorization is terminated or revoked sooner. Performed at [facility], [address] [place], [place] [address]      Discharge Instructions:   Discharge Instructions    Diet - low sodium heart healthy   Complete by: As directed    Increase activity slowly   Complete by: As directed      Allergies as of 09/02/2019   No Known Allergies     Medication List    STOP taking these medications   cephALEXin 500 MG capsule Commonly known as: KEFLEX   traMADol 50 MG tablet Commonly known as: Ultram     TAKE these medications   acyclovir 200 MG capsule Commonly known as: ZOVIRAX Take 2 capsules (400 mg total) by mouth 3 (three) times daily for 7 days.      Follow-up Information    Lynn Ito, MD. Go on 09/05/2019.   Specialty: Infectious Diseases Why: Go at 10:45am. Contact  information: [address] [place] [address] [phone]            Time coordinating discharge: 32 minutes  Signed:  Lurene Shadow  [facility] 09/02/2019, 9:04 PM

## 2019-09-03 LAB — RPR
RPR Ser Ql: REACTIVE — AB
RPR Titer: 1:64 {titer}

## 2019-09-03 LAB — T.PALLIDUM AB, TOTAL: T Pallidum Abs: REACTIVE — AB

## 2019-09-03 LAB — HIV-1 RNA QUANT-NO REFLEX-BLD
HIV 1 RNA Quant: <20 copies/mL
LOG10 HIV-1 RNA: UNDETERMINED log10copy/mL

## 2019-09-03 LAB — HCV COMMENT:

## 2019-09-03 LAB — HEPATITIS C ANTIBODY (REFLEX): HCV Ab: <0.1 s/co ratio (ref 0.0–0.9)

## 2019-09-04 LAB — HSV(HERPES SIMPLEX VRS) I + II AB-IGM: HSVI/II Comb IgM: 0.97 Ratio — ABNORMAL HIGH (ref 0.00–0.90)

## 2019-09-04 LAB — T.PALLIDUM AB, TOTAL: T Pallidum Abs: REACTIVE — AB

## 2019-09-05 ENCOUNTER — Inpatient Hospital Stay: Payer: Self-pay | Admitting: Infectious Diseases

## 2019-09-06 LAB — CULTURE, BLOOD (ROUTINE X 2)
Culture: NO GROWTH
Culture: NO GROWTH
Special Requests: ADEQUATE
Special Requests: ADEQUATE

## 2019-09-07 ENCOUNTER — Emergency Department
Admission: EM | Admit: 2019-09-07 | Discharge: 2019-09-07 | Disposition: A | Discharge status: Home / Self Care | Payer: Self-pay | Attending: Emergency Medicine | Admitting: Emergency Medicine

## 2019-09-07 ENCOUNTER — Emergency Department: Payer: Self-pay

## 2019-09-07 ENCOUNTER — Other Ambulatory Visit: Payer: Self-pay

## 2019-09-07 DIAGNOSIS — Y999 Unspecified external cause status: Secondary | ICD-10-CM | POA: Insufficient documentation

## 2019-09-07 DIAGNOSIS — S0083XA Contusion of other part of head, initial encounter: Secondary | ICD-10-CM

## 2019-09-07 DIAGNOSIS — Y939 Activity, unspecified: Secondary | ICD-10-CM | POA: Insufficient documentation

## 2019-09-07 DIAGNOSIS — Y929 Unspecified place or not applicable: Secondary | ICD-10-CM | POA: Insufficient documentation

## 2019-09-07 DIAGNOSIS — S0101XA Laceration without foreign body of scalp, initial encounter: Secondary | ICD-10-CM

## 2019-09-07 DIAGNOSIS — Z23 Encounter for immunization: Secondary | ICD-10-CM | POA: Insufficient documentation

## 2019-09-07 LAB — COMPREHENSIVE METABOLIC PANEL
ALT: 21 U/L (ref 0–44)
AST: 36 U/L (ref 15–41)
Albumin: 3.1 g/dL — ABNORMAL LOW (ref 3.5–5.0)
Alkaline Phosphatase: 65 U/L (ref 38–126)
Anion gap: 8 (ref 5–15)
BUN: 16 mg/dL (ref 6–20)
CO2: 21 mmol/L — ABNORMAL LOW (ref 22–32)
Calcium: 8.2 mg/dL — ABNORMAL LOW (ref 8.9–10.3)
Chloride: 110 mmol/L (ref 98–111)
Creatinine, Ser: 0.79 mg/dL (ref 0.44–1.00)
GFR calc Af Amer: >60 mL/min (ref >60)
GFR calc non Af Amer: >60 mL/min (ref >60)
Glucose, Bld: 134 mg/dL — ABNORMAL HIGH (ref 70–99)
Potassium: 3.5 mmol/L (ref 3.5–5.1)
Sodium: 139 mmol/L (ref 135–145)
Total Bilirubin: 0.6 mg/dL (ref 0.3–1.2)
Total Protein: 6.7 g/dL (ref 6.5–8.1)

## 2019-09-07 LAB — URINE DRUG SCREEN, QUALITATIVE (ARMC ONLY)
Amphetamines, Ur Screen: NOT DETECTED
Barbiturates, Ur Screen: NOT DETECTED
Benzodiazepine, Ur Scrn: NOT DETECTED
Cannabinoid 50 Ng, Ur ~~LOC~~: NOT DETECTED
Cocaine Metabolite,Ur ~~LOC~~: POSITIVE — AB
MDMA (Ecstasy)Ur Screen: NOT DETECTED
Methadone Scn, Ur: NOT DETECTED
Opiate, Ur Screen: NOT DETECTED
Phencyclidine (PCP) Ur S: NOT DETECTED
Tricyclic, Ur Screen: NOT DETECTED

## 2019-09-07 LAB — HEMOGLOBIN AND HEMATOCRIT, BLOOD
HCT: 31.2 % — ABNORMAL LOW (ref 36.0–46.0)
Hemoglobin: 10.1 g/dL — ABNORMAL LOW (ref 12.0–15.0)

## 2019-09-07 LAB — CBC
HCT: 28.6 % — ABNORMAL LOW (ref 36.0–46.0)
Hemoglobin: 8.6 g/dL — ABNORMAL LOW (ref 12.0–15.0)
MCH: 28.4 pg (ref 26.0–34.0)
MCHC: 30.1 g/dL (ref 30.0–36.0)
MCV: 94.4 fL (ref 80.0–100.0)
Platelets: 337 10*3/uL (ref 150–400)
RBC: 3.03 MIL/uL — ABNORMAL LOW (ref 3.87–5.11)
RDW: 12.4 % (ref 11.5–15.5)
WBC: 6.9 10*3/uL (ref 4.0–10.5)
nRBC: 0 % (ref 0.0–0.2)

## 2019-09-07 MED ORDER — SODIUM CHLORIDE 0.9 % IV BOLUS
1000.0000 mL | Freq: Once | INTRAVENOUS | Status: AC
  Administered 2019-09-07: 02:00:00 1000 mL via INTRAVENOUS

## 2019-09-07 MED ORDER — LIDOCAINE HCL (PF) 1 % IJ SOLN
INTRAMUSCULAR | Status: AC
  Administered 2019-09-07: 02:00:00
  Filled 2019-09-07: qty 10

## 2019-09-07 MED ORDER — KETOROLAC TROMETHAMINE 30 MG/ML IJ SOLN
30.0000 mg | Freq: Once | INTRAMUSCULAR | Status: AC
  Administered 2019-09-07: 30 mg via INTRAVENOUS
  Filled 2019-09-07: qty 1

## 2019-09-07 MED ORDER — KETOROLAC TROMETHAMINE 10 MG PO TABS: 10.0000 mg | ORAL_TABLET | Freq: Four times a day (QID) | ORAL | 0 refills | Status: AC | PRN

## 2019-09-07 MED ORDER — TETANUS-DIPHTH-ACELL PERTUSSIS 5-2.5-18.5 LF-MCG/0.5 IM SUSP
0.5000 mL | Freq: Once | INTRAMUSCULAR | Status: AC
  Administered 2019-09-07: 08:00:00 0.5 mL via INTRAMUSCULAR
  Filled 2019-09-07: qty 0.5
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
## 2019-09-07 NOTE — ED Notes (Signed)
Pt given graham crackers and peanut butter.
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
## 2019-09-07 NOTE — ED Notes (Signed)
Pt visualized resting in bed, eyes closed, rise and fall of chest noted, bed locked and low, will continue to monitor.
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
